# Patient Record
Sex: Female | Born: 2000 | Race: White | Hispanic: No | Marital: Single
Health system: Southern US, Community
[De-identification: ages and names within clinical notes are randomized; demographics above are authoritative.]

## PROBLEM LIST (undated history)

## (undated) DIAGNOSIS — Z803 Family history of malignant neoplasm of breast: Secondary | ICD-10-CM

## (undated) DIAGNOSIS — F419 Anxiety disorder, unspecified: Secondary | ICD-10-CM

## (undated) DIAGNOSIS — Z800 Family history of malignant neoplasm of digestive organs: Secondary | ICD-10-CM

## (undated) DIAGNOSIS — Z8042 Family history of malignant neoplasm of prostate: Secondary | ICD-10-CM

## (undated) DIAGNOSIS — Z8041 Family history of malignant neoplasm of ovary: Secondary | ICD-10-CM

## (undated) DIAGNOSIS — K5900 Constipation, unspecified: Secondary | ICD-10-CM

## (undated) DIAGNOSIS — F840 Autistic disorder: Secondary | ICD-10-CM

## (undated) DIAGNOSIS — F845 Asperger's syndrome: Secondary | ICD-10-CM

## (undated) HISTORY — PX: MYRINGOPLASTY: SUR873

## (undated) HISTORY — DX: Asperger's syndrome: F84.5

## (undated) HISTORY — DX: Constipation, unspecified: K59.00

## (undated) HISTORY — DX: Family history of malignant neoplasm of prostate: Z80.42

## (undated) HISTORY — DX: Family history of malignant neoplasm of breast: Z80.3

## (undated) HISTORY — PX: MOLE REMOVAL: SHX2046

## (undated) HISTORY — DX: Anxiety disorder, unspecified: F41.9

## (undated) HISTORY — DX: Family history of malignant neoplasm of digestive organs: Z80.0

## (undated) HISTORY — PX: ADENOIDECTOMY: SUR15

## (undated) HISTORY — DX: Family history of malignant neoplasm of ovary: Z80.41

---
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
## 2022-06-01 ENCOUNTER — Telehealth: Payer: Self-pay | Admitting: Genetic Counselor

## 2022-06-01 NOTE — Telephone Encounter (Signed)
Scheduled appt per 10/24 referral. Spoke to pt's mother, Lamar Naef, who requested her daughter be scheduled the same day/time as her. She will pass appt information along to pt.

## 2022-07-20 ENCOUNTER — Inpatient Hospital Stay: Payer: 59

## 2022-07-20 ENCOUNTER — Inpatient Hospital Stay: Payer: 59 | Attending: Genetic Counselor | Admitting: Genetic Counselor

## 2022-07-20 ENCOUNTER — Other Ambulatory Visit: Payer: Self-pay

## 2022-07-20 DIAGNOSIS — Z800 Family history of malignant neoplasm of digestive organs: Secondary | ICD-10-CM

## 2022-07-20 DIAGNOSIS — Z8041 Family history of malignant neoplasm of ovary: Secondary | ICD-10-CM

## 2022-07-20 DIAGNOSIS — Z8042 Family history of malignant neoplasm of prostate: Secondary | ICD-10-CM

## 2022-07-20 DIAGNOSIS — Z803 Family history of malignant neoplasm of breast: Secondary | ICD-10-CM | POA: Diagnosis not present

## 2022-07-21 ENCOUNTER — Encounter: Payer: Self-pay | Admitting: Genetic Counselor

## 2022-07-21 DIAGNOSIS — Z8041 Family history of malignant neoplasm of ovary: Secondary | ICD-10-CM | POA: Insufficient documentation

## 2022-07-21 DIAGNOSIS — Z8042 Family history of malignant neoplasm of prostate: Secondary | ICD-10-CM | POA: Insufficient documentation

## 2022-07-21 DIAGNOSIS — Z800 Family history of malignant neoplasm of digestive organs: Secondary | ICD-10-CM | POA: Insufficient documentation

## 2022-07-21 DIAGNOSIS — Z803 Family history of malignant neoplasm of breast: Secondary | ICD-10-CM | POA: Insufficient documentation

## 2022-07-21 NOTE — Progress Notes (Signed)
REFERRING PROVIDER: Marda Stalker, PA-C [place],  [place] [address]  PRIMARY PROVIDER:  Kathyrn Lass, MD  PRIMARY REASON FOR VISIT:  1. Family history of ovarian cancer   2. Family history of breast cancer   3. Family history of colon cancer   4. Family history of prostate cancer      HISTORY OF PRESENT ILLNESS:   Kathy Davis, a 21 y.o. female, was seen for a [facility] cancer genetics consultation at the request of Kathy Davis due to a family history of cancer and a known BRCA2 mutation in her maternal great aunt.  Kathy Davis presents to clinic today to discuss the possibility of a hereditary predisposition to cancer, genetic testing, and to further clarify her future cancer risks, as well as potential cancer risks for family members.   Kathy Davis is a 21 y.o. female with no personal history of cancer.    CANCER HISTORY:  Oncology History   No history exists.     RISK FACTORS:  Menarche was at age 14.  First live birth at age N/A.  OCP use for approximately 1 years.  Ovaries intact: yes.  Hysterectomy: no.  Menopausal status: premenopausal.  HRT use: 0 years. Colonoscopy: no; not examined. Mammogram within the last year: no. Number of breast biopsies: 0. Up to date with pelvic exams: no. Any excessive radiation exposure in the past: no  Past Medical History:  Diagnosis Date   Anxiety    Asperger's syndrome    Autism spectrum    Constipation    Family history of breast cancer    Family history of colon cancer    Family history of ovarian cancer    Family history of prostate cancer     Past Surgical History:  Procedure Laterality Date   ADENOIDECTOMY     MOLE REMOVAL     MYRINGOPLASTY      Social History   Socioeconomic History   Marital status: Single    Spouse name: Not on file   Number of children: Not on file   Years of education: Not on file   Highest education level: Not on file  Occupational History   Not on file   Tobacco Use   Smoking status: Passive Smoke Exposure - Never Smoker   Smokeless tobacco: Never  Vaping Use   Vaping Use: Never used  Substance and Sexual Activity   Alcohol use: No   Drug use: No   Sexual activity: Not on file  Other Topics Concern   Not on file  Social History Narrative   Work or School: going to [employer] classical school this fall - 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  Significant diagnoses are listed below: Family History  Problem Relation Age of Onset   Depression Mother    Diabetes Maternal Grandfather    Dementia Maternal Grandfather    Ovarian cancer Other  PGF's sister   Colon cancer Other        PGF's sister   Pancreatic cancer Other        PGF's father   Prostate cancer Maternal Great-grandfather        MGM's father   Breast cancer Maternal Great-grandmother        MGMs mother     The patient does not have children and does not have maternal siblings.  Both parents are living.  The patient does not keep in touch with her father and no information is known on his side.  The patient's mother is living and cancer free. She has a brother who is cancer free.  The maternal grandparents are living.  The grandfather's sister was diagnosed with ovarian, colon and ureter cancer.  She had a BRCA2 mutation on her tumor which the patient thinks was confirmed on the germline.  The other sister has a daughter who had breast cancer at 65 and reportedly has a BRCA2  mutation. The maternal grandfather's father died of pancreatic cancer.  The grandmothers mother had breast cancer and her father had prostate cancer.  Kathy Davis is aware of previous family history of genetic testing for hereditary cancer risks. Patient's maternal ancestors are of [place], [place], [place], [place] and [place] descent, and paternal ancestors are of Caucasian descent. There is no reported Ashkenazi Jewish ancestry. There is no known consanguinity.  GENETIC COUNSELING ASSESSMENT: Kathy Davis is a 21 y.o. female with a family history of cancer which is somewhat suggestive of a hereditary breast and ovarian cancer syndrome and predisposition to cancer given the combination of cancers and known BRCA2 mutation. We, therefore, discussed and recommended the following at today's visit.   DISCUSSION: We discussed that, in general, most cancer is not inherited in families, but instead is sporadic or familial. Sporadic cancers occur by chance and typically happen at older ages (>50 years) as this type of cancer is caused by genetic changes acquired during an individual's lifetime. Some families have more cancers than would be expected by chance; however, the ages or types of cancer are not consistent with a known genetic mutation or known genetic mutations have been ruled out. This type of familial cancer is thought to be due to a combination of multiple genetic, environmental, hormonal, and lifestyle factors. While this combination of factors likely increases the risk of cancer, the exact source of this risk is not currently identifiable or testable.  We discussed that 5 - 10% of breast cancer and up to 20% of ovarian cancer is hereditary, with most cases associated with BRCA mutations.  There are other genes that can be associated with hereditary breast and ovarian cancer syndromes, however the known BRCA2 makes other gene mutations less likely.  Kathy Davis has an approximate 12.5% risk for having a BRCA2  mutation based on her maternal great aunt testing positive.  Her mother is with her at today's visit and is considering undergoing testing.  We recommend that her mother undergo testing first, and depending on the mother's results will determine if we need to test Kathy Davis.    We discussed that some people do not want to undergo genetic testing due to fear of genetic discrimination.  A federal law called the Genetic Information Non-Discrimination Act (GINA) of 2008 helps protect individuals against genetic discrimination based on their genetic test results.  It impacts both health insurance and employment.  With health insurance, it protects against increased premiums, being kicked off insurance or  being forced to take a test in order to be insured.  For employment it protects against hiring, firing and promoting decisions based on genetic test results.  GINA does not apply to those in the [employer], those who work for companies with less than 15 employees, and new life insurance or long-term disability insurance policies.  Health status due to a cancer diagnosis is not protected under GINA.   We reviewed the characteristics, features and inheritance patterns of hereditary cancer syndromes. We also discussed genetic testing, including the appropriate family members to test, the process of testing, insurance coverage and turn-around-time for results. We discussed the implications of a negative, positive, carrier and/or variant of uncertain significant result. Kathy Davis  was offered a common hereditary cancer panel (47 genes) and an expanded pan-cancer panel (77 genes). Kathy Davis was informed of the benefits and limitations of each panel, including that expanded pan-cancer panels contain genes that do not have clear management guidelines at this point in time.  We also discussed that as the number of genes included on a panel increases, the chances of variants of uncertain significance increases.Based on the  lack of information of her paternal side of the family, we would recommend for Kathy Davis to pursue genetic testing for the CancerNext-Expanded+RNAinsight gene panel.   The CancerNext-Expanded gene panel offered by [facility] and includes sequencing and rearrangement analysis for the following 77 genes: AIP, ALK, APC*, ATM*, AXIN2, BAP1, BARD1, BLM, BMPR1A, BRCA1*, BRCA2*, BRIP1*, CDC73, CDH1*, CDK4, CDKN1B, CDKN2A, CHEK2*, CTNNA1, DICER1, FANCC, FH, FLCN, GALNT12, KIF1B, LZTR1, MAX, MEN1, MET, MLH1*, MSH2*, MSH3, MSH6*, MUTYH*, NBN, NF1*, NF2, NTHL1, PALB2*, PHOX2B, PMS2*, POT1, PRKAR1A, PTCH1, PTEN*, RAD51C*, RAD51D*, RB1, RECQL, RET, SDHA, SDHAF2, SDHB, SDHC, SDHD, SMAD4, SMARCA4, SMARCB1, SMARCE1, STK11, SUFU, TMEM127, TP53*, TSC1, TSC2, VHL and XRCC2 (sequencing and deletion/duplication); EGFR, EGLN1, HOXB13, KIT, MITF, PDGFRA, POLD1, and POLE (sequencing only); EPCAM and GREM1 (deletion/duplication only). DNA and RNA analyses performed for * genes.   Based on Kathy Davis's family history of cancer, she meets medical criteria for genetic testing. Despite that she meets criteria, she may still have an out of pocket cost. We discussed that if her out of pocket cost for testing is over $100, the laboratory will call and confirm whether she wants to proceed with testing.  If the out of pocket cost of testing is less than $100 she will be billed by the genetic testing laboratory.   PLAN: We will wait on testing Kathy Davis until her mother's test result comes back.  Depending on that result will determine whether Kathy Davis will need to be tested.  Based on Kathy Davis's family history, we recommended her mother have genetic counseling and testing. Kathy Davis will let us know if we can be of any assistance in coordinating genetic counseling and/or testing for this family member.   Lastly, we encouraged Kathy Davis to remain in contact with cancer genetics annually so that we can continuously update the  family history and inform her of any changes in cancer genetics and testing that may be of benefit for this family.   Kathy Davis questions were answered to her satisfaction today. Our contact information was provided should additional questions or concerns arise. Thank you for the referral and allowing [place] to share in the care of your patient.   Mansur Patti P. Florene Glen, [place], [facility] Licensed, Insurance risk surveyor Santiago [email or web address] phone: [phone]  The patient was seen for a total of 45 minutes in face-to-face genetic counseling.  The patient brought her mother. Drs. Michell Heinrich, and/or [place] were available for questions, if needed..    _______________________________________________________________________ For Office Staff:  Number of people involved in session: 2 Was an Intern/ student involved with case: no

## 2022-08-04 ENCOUNTER — Other Ambulatory Visit: Payer: Self-pay

## 2022-08-04 ENCOUNTER — Encounter: Payer: Self-pay | Admitting: Genetic Counselor

## 2022-08-11 DIAGNOSIS — F840 Autistic disorder: Secondary | ICD-10-CM | POA: Diagnosis not present

## 2022-08-25 DIAGNOSIS — F4325 Adjustment disorder with mixed disturbance of emotions and conduct: Secondary | ICD-10-CM | POA: Diagnosis not present

## 2022-09-02 DIAGNOSIS — F649 Gender identity disorder, unspecified: Secondary | ICD-10-CM | POA: Diagnosis not present

## 2022-09-08 DIAGNOSIS — F840 Autistic disorder: Secondary | ICD-10-CM | POA: Diagnosis not present

## 2022-09-22 DIAGNOSIS — F840 Autistic disorder: Secondary | ICD-10-CM | POA: Diagnosis not present

## 2022-10-06 DIAGNOSIS — F840 Autistic disorder: Secondary | ICD-10-CM | POA: Diagnosis not present

## 2022-10-20 DIAGNOSIS — F840 Autistic disorder: Secondary | ICD-10-CM | POA: Diagnosis not present

## 2022-12-01 DIAGNOSIS — F840 Autistic disorder: Secondary | ICD-10-CM | POA: Diagnosis not present

## 2022-12-15 DIAGNOSIS — F840 Autistic disorder: Secondary | ICD-10-CM | POA: Diagnosis not present

## 2023-01-26 DIAGNOSIS — F840 Autistic disorder: Secondary | ICD-10-CM | POA: Diagnosis not present

## 2023-01-26 DIAGNOSIS — F649 Gender identity disorder, unspecified: Secondary | ICD-10-CM | POA: Diagnosis not present

## 2023-02-09 DIAGNOSIS — F840 Autistic disorder: Secondary | ICD-10-CM | POA: Diagnosis not present

## 2023-02-23 DIAGNOSIS — F840 Autistic disorder: Secondary | ICD-10-CM | POA: Diagnosis not present

## 2023-03-09 DIAGNOSIS — F840 Autistic disorder: Secondary | ICD-10-CM | POA: Diagnosis not present

## 2023-03-23 DIAGNOSIS — F840 Autistic disorder: Secondary | ICD-10-CM | POA: Diagnosis not present

## 2023-04-06 DIAGNOSIS — F640 Transsexualism: Secondary | ICD-10-CM | POA: Diagnosis not present

## 2023-04-20 DIAGNOSIS — F840 Autistic disorder: Secondary | ICD-10-CM | POA: Diagnosis not present

## 2023-05-04 DIAGNOSIS — F840 Autistic disorder: Secondary | ICD-10-CM | POA: Diagnosis not present

## 2023-05-18 DIAGNOSIS — F840 Autistic disorder: Secondary | ICD-10-CM | POA: Diagnosis not present

## 2023-06-01 DIAGNOSIS — F840 Autistic disorder: Secondary | ICD-10-CM | POA: Diagnosis not present

## 2023-06-09 DIAGNOSIS — M2550 Pain in unspecified joint: Secondary | ICD-10-CM | POA: Diagnosis not present

## 2023-06-09 DIAGNOSIS — F411 Generalized anxiety disorder: Secondary | ICD-10-CM | POA: Diagnosis not present

## 2023-06-09 DIAGNOSIS — F988 Other specified behavioral and emotional disorders with onset usually occurring in childhood and adolescence: Secondary | ICD-10-CM | POA: Diagnosis not present

## 2023-07-18 DIAGNOSIS — F419 Anxiety disorder, unspecified: Secondary | ICD-10-CM | POA: Diagnosis not present

## 2023-07-18 DIAGNOSIS — F649 Gender identity disorder, unspecified: Secondary | ICD-10-CM | POA: Diagnosis not present

## 2023-07-18 DIAGNOSIS — Z79899 Other long term (current) drug therapy: Secondary | ICD-10-CM | POA: Diagnosis not present

## 2023-07-18 DIAGNOSIS — F640 Transsexualism: Secondary | ICD-10-CM | POA: Diagnosis not present

## 2023-07-18 DIAGNOSIS — K581 Irritable bowel syndrome with constipation: Secondary | ICD-10-CM | POA: Diagnosis not present

## 2023-07-18 DIAGNOSIS — F32A Depression, unspecified: Secondary | ICD-10-CM | POA: Diagnosis not present

## 2023-07-18 DIAGNOSIS — Z881 Allergy status to other antibiotic agents status: Secondary | ICD-10-CM | POA: Diagnosis not present

## 2023-10-24 DIAGNOSIS — F649 Gender identity disorder, unspecified: Secondary | ICD-10-CM | POA: Diagnosis not present

## 2023-11-03 ENCOUNTER — Other Ambulatory Visit (HOSPITAL_COMMUNITY)
Admission: RE | Admit: 2023-11-03 | Discharge: 2023-11-03 | Disposition: A | Discharge status: Home / Self Care | Payer: Self-pay | Source: Ambulatory Visit | Attending: Nurse Practitioner | Admitting: Nurse Practitioner

## 2023-11-03 ENCOUNTER — Other Ambulatory Visit: Payer: Self-pay | Admitting: Nurse Practitioner

## 2023-11-03 DIAGNOSIS — N939 Abnormal uterine and vaginal bleeding, unspecified: Secondary | ICD-10-CM | POA: Diagnosis not present

## 2023-11-03 DIAGNOSIS — Z113 Encounter for screening for infections with a predominantly sexual mode of transmission: Secondary | ICD-10-CM | POA: Diagnosis not present

## 2023-11-03 DIAGNOSIS — R300 Dysuria: Secondary | ICD-10-CM | POA: Diagnosis not present

## 2023-11-03 DIAGNOSIS — Z124 Encounter for screening for malignant neoplasm of cervix: Secondary | ICD-10-CM | POA: Insufficient documentation

## 2023-11-03 DIAGNOSIS — Z975 Presence of (intrauterine) contraceptive device: Secondary | ICD-10-CM | POA: Diagnosis not present

## 2023-11-03 DIAGNOSIS — R102 Pelvic and perineal pain: Secondary | ICD-10-CM | POA: Diagnosis not present

## 2023-11-08 LAB — CYTOLOGY - PAP: Diagnosis: NEGATIVE

## 2023-11-15 ENCOUNTER — Encounter: Payer: Self-pay | Admitting: Internal Medicine

## 2024-06-11 DIAGNOSIS — F411 Generalized anxiety disorder: Secondary | ICD-10-CM | POA: Diagnosis not present

## 2024-06-11 DIAGNOSIS — F988 Other specified behavioral and emotional disorders with onset usually occurring in childhood and adolescence: Secondary | ICD-10-CM | POA: Diagnosis not present

## 2024-06-11 DIAGNOSIS — D229 Melanocytic nevi, unspecified: Secondary | ICD-10-CM | POA: Diagnosis not present

## 2024-06-20 DIAGNOSIS — D229 Melanocytic nevi, unspecified: Secondary | ICD-10-CM | POA: Diagnosis not present

## 2024-06-20 DIAGNOSIS — D2372 Other benign neoplasm of skin of left lower limb, including hip: Secondary | ICD-10-CM | POA: Diagnosis not present

## 2024-06-20 DIAGNOSIS — D2272 Melanocytic nevi of left lower limb, including hip: Secondary | ICD-10-CM | POA: Diagnosis not present

## 2024-06-20 DIAGNOSIS — D485 Neoplasm of uncertain behavior of skin: Secondary | ICD-10-CM | POA: Diagnosis not present

## 2024-06-26 NOTE — Progress Notes (Unsigned)
   LILLETTE Ileana Collet, PhD, LAT, ATC acting as a scribe for Artist Lloyd, MD.  Kathy Davis is a 23 y.o. female who presents to [facility] today for evaluation of hypermobility.  MS: {ZIDFDX:66204} Skin/Immune reactions: {EDSDERMIMM:33796} Neurological: {EDSNEURO:33797} ANS: Anxiety / Panic Respiratory: {EDSRESP:33799} CV: {EDSCARDIO:33816} GI:  {EDSGI:33817} Genitourinary: {EDSUROGEN:33818} Hands & Feet: {EDSHANDSFEET:33819}  Treatments tried: ***Have you done formal Physical Therapy? If so, when, where and for how long? Have you or do you participate in a home exercise program?   Pertinent review of systems: ***  Relevant historical information: ***   Exam:  There were no vitals taken for this visit. General: Well Developed, well nourished, and in no acute distress.   MSK: ***    Lab and Radiology Results No results found for this or any previous visit (from the past 72 hours). No results found.     Assessment and Plan: 23 y.o. female with ***   PDMP not reviewed this encounter. No orders of the defined types were placed in this encounter.  No orders of the defined types were placed in this encounter.    Discussed warning signs or symptoms. Please see discharge instructions. Patient expresses understanding.   ***

## 2024-06-27 ENCOUNTER — Ambulatory Visit: Payer: Self-pay | Admitting: Family Medicine

## 2024-06-27 ENCOUNTER — Encounter: Payer: Self-pay | Admitting: Family Medicine

## 2024-06-27 VITALS — BP 108/72 | HR 72 | Ht 65.0 in | Wt 130.0 lb

## 2024-06-27 DIAGNOSIS — Q7962 Hypermobile Ehlers-Danlos syndrome: Secondary | ICD-10-CM | POA: Diagnosis not present

## 2024-06-27 DIAGNOSIS — I951 Orthostatic hypotension: Secondary | ICD-10-CM | POA: Diagnosis not present

## 2024-06-27 NOTE — Patient Instructions (Signed)
 Thank you for coming in today.   Consider [facility] or the [facility] for Recumbent exercise.   A referral for physical therapy has been submitted. A representative from the physical therapy office will contact you to coordinate scheduling after confirming your benefits with your insurance provider. If you do not hear from the physical therapy office within the next 1-2 weeks, please let us  know.   Check out the book Disjointed Navigating the Diagnosis and Management of Hypermobile Ehlers-Danlos Syndrome and Hypermobility Spectrum Disorders.    For POTS symptoms: Consume 3 L water and 8-12 gram of salt per day   Check out these websites for exercises to try if you have POTS ([employer] Protocol, [place] Protocol, and [employer] Protocol) [email or web address]-[email or web address]/ https://dean.info/   Guide to Prof. Russek's Patient Self-Care Handouts [email or web address].Http://www.black-smith.[email or web address]/  Orthostatic Intolerance and Postural Orthostatic Tachycardia Self-Care Checklist [email or web address].Wvlyxc.com  Steps To Managing Your Mast Cell Activation Syndrome [email or web address].pdf  See you back in 1 month

## 2024-06-28 DIAGNOSIS — N939 Abnormal uterine and vaginal bleeding, unspecified: Secondary | ICD-10-CM | POA: Insufficient documentation

## 2024-06-28 DIAGNOSIS — F411 Generalized anxiety disorder: Secondary | ICD-10-CM | POA: Insufficient documentation

## 2024-06-28 DIAGNOSIS — I951 Orthostatic hypotension: Secondary | ICD-10-CM | POA: Insufficient documentation

## 2024-06-28 DIAGNOSIS — F4011 Social phobia, generalized: Secondary | ICD-10-CM | POA: Insufficient documentation

## 2024-06-28 DIAGNOSIS — Q7962 Hypermobile Ehlers-Danlos syndrome: Secondary | ICD-10-CM | POA: Insufficient documentation

## 2024-06-28 DIAGNOSIS — F909 Attention-deficit hyperactivity disorder, unspecified type: Secondary | ICD-10-CM | POA: Insufficient documentation

## 2024-06-28 DIAGNOSIS — F332 Major depressive disorder, recurrent severe without psychotic features: Secondary | ICD-10-CM | POA: Insufficient documentation

## 2024-07-19 NOTE — Therapy (Unsigned)
 OUTPATIENT PHYSICAL THERAPY HYPERMOBILITY EVAL  Patient Name: Kathy Davis MRN: [id number] DOB:20-Dec-2000, 23 y.o., adult Today's Date: 07/20/2024  END OF SESSION:  PT End of Session - 07/20/24 0938     Visit Number 1    Number of Visits 16    Date for Recertification  09/14/24    Authorization Type BCBS    PT Start Time 0935    PT Stop Time 1028    PT Time Calculation (min) 53 min    Activity Tolerance Patient tolerated treatment well    Behavior During Therapy [facility] for tasks assessed/performed;Flat affect          Past Medical History:  Diagnosis Date   Anxiety    Asperger's syndrome    Autism spectrum    Constipation    Family history of breast cancer    Family history of colon cancer    Family history of ovarian cancer    Family history of prostate cancer    Past Surgical History:  Procedure Laterality Date   ADENOIDECTOMY     MOLE REMOVAL     MYRINGOPLASTY     Patient Active Problem List   Diagnosis Date Noted   Abnormal uterine bleeding 06/28/2024   Attention deficit hyperactivity disorder 06/28/2024   Generalized anxiety disorder 06/28/2024   Generalized social phobia 06/28/2024   Severe recurrent major depression without psychotic features (HCC) 06/28/2024   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   Dysautonomia orthostatic hypotension syndrome 06/28/2024   Gender dysphoria 07/18/2023   Family history of ovarian cancer 07/21/2022   Family history of breast cancer 07/21/2022   Family history of colon cancer 07/21/2022   Family history of prostate cancer 07/21/2022   Autistic disorder 05/04/2013    PCP: Katina Pfeiffer PA   REFERRING PROVIDER: Joane Birmingham MD   REFERRING DIAG: Hypermobile Ehlers Danlos Syndrome  Rationale for Evaluation and Treatment: Rehabilitation  THERAPY DIAG:  Hypermobility syndrome  Other symptoms and signs involving the musculoskeletal system  Muscle weakness (generalized)  Pain in both knees, unspecified  chronicity  ONSET DATE: chronic   SUBJECTIVE:                                                                                                                                                                                           SUBJECTIVE STATEMENT: Patient was referred to Dr. Joane, found him on the EDS website.   Patient has always been in pain.  Noticed after the breast surgery that the pain was much better (due to medications).   Patient's mother is present to assist with history and intake.  Patient endorses a history of leg and  foot pain when they were younger.  They can remember having difficulty breathing when they were more active (running).  They identify knee and wrist pain most problematic. Cannot draw as long as they want to.  Stopped wearing the compression glove but does have one. The pain in the knees is mostly after activity, is the back of the knees but some in the front too.  They cannot stand you longer than 10 min.  Walking is better than standing but not too long. Denies weakness, has numbness occ due to positioning.  Uses the cane leaving the house.   Was more deconditioned since getting COVID last yr.  Not diagnosed with POTS or MCAS at this time.    Patient reports symptoms and/or instability in the following areas:  - Cervical Spine: [x] Pain [] Instability [] Limited ROM [] Paresthesia  - Thoracic Spine: [] Pain [] Instability  - Lumbar Spine: [x] Pain [] Instability [] Frequent locking/giving out  - Shoulder: [] L [] R  [] Subluxation [] Dislocations [] Pain [] Fatigue  - Elbow: [] L [] R  [] Instability [] Hyperextension [] Pain - occ. L UE instability X  - Wrist/Hand: [x] L [x] R  [] Instability [x] Fatigue with use [x] Pain  - Hip: [] L [] R  [] Instability [] Pain [] Clicking [] Gait deviations  - Knee: [x] L [x] R  [x] Instability [x] Hyperextension [x] Pain  - Ankle/Foot: [x] L [x] R  [x] Instability [x] Frequent sprains Pain X  Does the patient have a diagnosis of any of the  following: Fatigue[x]  Brain fog[x]  Lightheadedness[x]  Allergies[]  Occ.  GI[x]  Neurodiverse[x]  ADHD, Autism   Additional Notes:  ___________ nonbinary ___________________________   Patient-reported Beighton Score (if known): _______/9  Clinician-assessed Beighton Score (today): _______/9     PERTINENT HISTORY:  Chronic joint pain, Chronic wide spread muscle pain, Joint Hypermobility, anxiety, ADHD, autistic, strong family history of various cancers.    Relevant historical information: Patient underwent mastectomy 2024    PAIN:  Are you having pain? Yes: NPRS scale: 3/10-4/10 Pain location: knees, post> ant.   Pain description: not sure  Aggravating factors: standing, walking  Relieving factors: rest, OTC Ibuprofen    PRECAUTIONS: None  RED FLAGS: None   WEIGHT BEARING RESTRICTIONS: No  FALLS:  Has patient fallen in last 6 months? No  LIVING ENVIRONMENT: Lives with: lives with their family Lives in: House/apartment Stairs: both in and out- 5 STE, small 3 steps inside  Has following equipment at home: Single point cane  Cannot do all the steps at once.   OCCUPATION: not currently, was in [employer] college   PLOF: Independent, Vocation/Vocational requirements: not working, Leisure: drawing, does get overwhelmed with pain/sensory , and TV/books   PATIENT GOALS: To have less pain.    NEXT MD VISIT: as needed   OBJECTIVE:  Note: Objective measures were completed at Evaluation unless otherwise noted.  DIAGNOSTIC FINDINGS:  None   CARDIO/ORTHOSTATICS: Baseline RHR 68 Standing HR 74 Baseline BP 102/68 Standing BP 106/75   PATIENT SURVEYS:  PSFS: Given to patient to fill out at home   COGNITION: Overall cognitive status: Within functional limits for tasks assessed     SENSATION: Mild occ in feet    POSTURE: rounded shoulders, forward head, weight shift left, and leans L in standing   PALPATION: Tender to palpation bilateral infrapatellar area some mild  tenderness posteriorly as well. Pain reported with palpation to the thoracic and lumbar spine.  Thoracic spinal segments quite stiff, with mild hypermobility in the lumbar spine  LUMBAR ROM: Not tested  AROM eval  Flexion   Extension   Right lateral flexion   Left lateral flexion   Right rotation  Left rotation    (Blank rows = not tested)  LOWER EXTREMITY ROM:   WNL  Bilateral hamstring tightness Passive  Right eval Left eval  Hip flexion    Hip extension    Hip abduction    Hip adduction    Hip internal rotation 25-30 25-30  Hip external rotation 80-90 80-90  Knee flexion    Knee extension    Ankle dorsiflexion    Ankle plantarflexion    Ankle inversion    Ankle eversion     (Blank rows = not tested)  LOWER EXTREMITY MMT:    MMT Right eval Left eval  Hip flexion 4 4  Hip extension    Hip abduction    Hip adduction    Hip internal rotation    Hip external rotation    Knee flexion 4- 4-  Knee extension 4 4  Ankle dorsiflexion    Ankle plantarflexion    Ankle inversion    Ankle eversion     (Blank rows = not tested)  LUMBAR SPECIAL TESTS:  NT    CERVICAL ROM: NT    UE ROM: NT    UE MMT: NT   MMT Right 07/20/2024 Left 07/20/2024  Shoulder flexion    Shoulder extension    Shoulder abduction    Shoulder adduction    Shoulder extension    Shoulder internal rotation    Shoulder external rotation    Middle trapezius    Lower trapezius    Elbow flexion    Elbow extension    Wrist flexion    Wrist extension    Wrist ulnar deviation    Wrist radial deviation    Wrist pronation    Wrist supination    Grip strength     (Blank rows = not tested)  CERVICAL SPECIAL TESTS:  NT   FUNCTIONAL TESTS:  5 times sit to stand: 18.6 sec, HR up to 88   2 min walk test, 210 feet, min to mod dyspnea no increased pain      Beighton Scale Lumbar (_/1) Knees (_/2) Elbows (_/2)  5th digit (_2) Thumb (_/2) Comment on hips, shoulders     GAIT: Distance walked: 210 feet Assistive device utilized: Single point cane Level of assistance: Modified independence Comments: Slow deliberate gait reliance on the cane using in the right hand  TREATMENT DATE:   07/20/24:  PT evaluation completed 2-minute walk test, STS  Patient performed 5-10 repetitions of home exercise program Patient was instructed on general hypermobility principles of exercise Patient will be getting a reformer Christmas as a gift and so we discussed Pilates a bit as a pertains to hypermobility Advised to expect a bit of pain relief as strength improves                                                                                                                                    PATIENT EDUCATION:  Education details: see above Posture, alignment, neutral zone and joint protection PNE/Explain pain   Joint inflammation/collagen/ligament laxity, muscular support Stretching vs stabilizing  Person educated: Patient Education method: Explanation, Demonstration, Verbal cues, and Handouts Education comprehension: verbalized understanding and needs further education   HOME EXERCISE PROGRAM: Access Code: [email or web address] URL: [email or web address] Date: 07/20/2024 Prepared by: Delon Norma  Exercises - Active Straight Leg Raise with Quad Set  - 1 x daily - 7 x weekly - 2 sets - 5-10 reps - 5 hold - Sidelying Hip Abduction  - 1 x daily - 7 x weekly - 2 sets - 5-10 reps - 5 hold - Supine Hamstring Stretch  - 1 x daily - 7 x weekly - 2 sets - 5-10 reps - 5-10 hold - Pilates Bridge  - 1 x daily - 7 x weekly - 2 sets - 5-10 reps - 5 hold  ASSESSMENT:  CLINICAL IMPRESSION: Patient is a 23 y.o. adult who was seen today for physical therapy evaluation and treatment for H-EDS.  Patient will be having a precancerous area removed from their left leg soon which could preclude them from participating fully in rehab but we can definitely work around  it. Patient is most limited by cardiorespiratory endurance and lower body weakness.  OBJECTIVE IMPAIRMENTS: Abnormal gait, cardiopulmonary status limiting activity, decreased activity tolerance, decreased balance, decreased coordination, decreased endurance, decreased knowledge of condition, decreased mobility, difficulty walking, decreased ROM, decreased strength, dizziness, increased fascial restrictions, impaired flexibility, improper body mechanics, postural dysfunction, pain, and joint hypermobility/instability .   ACTIVITY LIMITATIONS: carrying, lifting, bending, standing, squatting, stairs, transfers, hygiene/grooming, locomotion level, and showering -has chair   PARTICIPATION LIMITATIONS: cleaning, laundry, interpersonal relationship, driving, shopping, community activity, occupation, and school  PERSONAL FACTORS: Past/current experiences, Social background, Time since onset of injury/illness/exacerbation, and 3+ comorbidities: Neuro diversity, anxiety, chronic fatigue and joint pain are also affecting patient's functional outcome.   REHAB POTENTIAL: Excellent  CLINICAL DECISION MAKING: Evolving/moderate complexity  EVALUATION COMPLEXITY: Moderate   GOALS: Goals reviewed with patient? Yes  SHORT TERM GOALS: Target date: 08/17/2024    Patient will be able to show independence for initial HEP to include posture, core and hip strength and stability.   Baseline: Goal status: INITIAL  2.  Patient will be able to demonstrate proper posture and lifting techniques related to spine health and reduction of symptoms.   Baseline:  Goal status: INITIAL  3.  Patient will complete the patient-specific functional scale and set goal  Baseline:  Goal status: INITIAL   LONG TERM GOALS: Target date: 09/14/2024    Patient will be independent with final HEP upon discharge from PT and report consistent benefit following exercise completion.    Baseline:  Goal status: INITIAL  2.  Patient  will be able to show good core stability and motor control with mat level intermediate stabilization exercises Baseline:  Goal status: INITIAL  3.  Patient will be I with concepts of joint protection and stability as it pertains to joint hypermobility. Baseline:  Goal status: INITIAL  4.  Patient will improve 2-minute walk test with or without assistive device by 100 feet or more  Baseline: 210 with cane  Goal status: INITIAL  5.  Patient will be able to report the ability to complete physical therapy/daily home program as well as 1 other activity that same day (shopping, meal)  Baseline:  Goal status: INITIAL  6.  Patient will demonstrate 5/ 5 lower body strength to maximize gait and functional strength. Baseline:  Goal status: INITIAL  PLAN:  PT FREQUENCY: 1-2x/week  PT DURATION: 8 weeks  PLANNED INTERVENTIONS: 97164- PT Re-evaluation, 97750- Physical Performance Testing, 97110-Therapeutic exercises, 97530- Therapeutic activity, [id number]- Neuromuscular re-education, 97535- Self Care, [id number]- Manual therapy, 20560 (1-2 muscles), 20561 (3+ muscles)- Dry Needling, Patient/Family education, Balance training, Stair training, Cryotherapy, and Moist heat.  PLAN FOR NEXT SESSION: check HEP, add as tolerated. Core, try reformer, recumbent bike    Kiet Geer, PT 07/20/2024, 10:48 AM

## 2024-07-20 ENCOUNTER — Ambulatory Visit (INDEPENDENT_AMBULATORY_CARE_PROVIDER_SITE_OTHER): Admitting: Physical Therapy

## 2024-07-20 ENCOUNTER — Encounter: Payer: Self-pay | Admitting: Physical Therapy

## 2024-07-20 DIAGNOSIS — R29898 Other symptoms and signs involving the musculoskeletal system: Secondary | ICD-10-CM | POA: Diagnosis not present

## 2024-07-20 DIAGNOSIS — M357 Hypermobility syndrome: Secondary | ICD-10-CM | POA: Diagnosis not present

## 2024-07-20 DIAGNOSIS — M6281 Muscle weakness (generalized): Secondary | ICD-10-CM | POA: Diagnosis not present

## 2024-07-20 DIAGNOSIS — M25562 Pain in left knee: Secondary | ICD-10-CM | POA: Diagnosis not present

## 2024-07-20 DIAGNOSIS — M25561 Pain in right knee: Secondary | ICD-10-CM | POA: Diagnosis not present

## 2024-07-27 ENCOUNTER — Ambulatory Visit: Admitting: Family Medicine

## 2024-07-27 ENCOUNTER — Encounter: Payer: Self-pay | Admitting: Family Medicine

## 2024-07-27 ENCOUNTER — Ambulatory Visit

## 2024-07-27 VITALS — BP 122/78 | HR 77 | Ht 65.0 in | Wt 132.0 lb

## 2024-07-27 DIAGNOSIS — G8929 Other chronic pain: Secondary | ICD-10-CM | POA: Diagnosis not present

## 2024-07-27 DIAGNOSIS — M79642 Pain in left hand: Secondary | ICD-10-CM

## 2024-07-27 DIAGNOSIS — D2272 Melanocytic nevi of left lower limb, including hip: Secondary | ICD-10-CM | POA: Diagnosis not present

## 2024-07-27 DIAGNOSIS — F649 Gender identity disorder, unspecified: Secondary | ICD-10-CM | POA: Diagnosis not present

## 2024-07-27 DIAGNOSIS — M79641 Pain in right hand: Secondary | ICD-10-CM

## 2024-07-27 DIAGNOSIS — M25561 Pain in right knee: Secondary | ICD-10-CM

## 2024-07-27 DIAGNOSIS — M25562 Pain in left knee: Secondary | ICD-10-CM

## 2024-07-27 DIAGNOSIS — Q7962 Hypermobile Ehlers-Danlos syndrome: Secondary | ICD-10-CM

## 2024-07-27 DIAGNOSIS — L905 Scar conditions and fibrosis of skin: Secondary | ICD-10-CM | POA: Diagnosis not present

## 2024-07-27 DIAGNOSIS — I951 Orthostatic hypotension: Secondary | ICD-10-CM

## 2024-07-27 NOTE — Progress Notes (Signed)
 "        I, Leotis Batter, CMA acting as a scribe for Artist Lloyd, MD.  Kathy Davis is a 23 y.o. adult who presents to [facility] today for 90-month f/u hEDS and dysautonomia. Pt was last seen by Dr. Lloyd on 06/27/24 and was advised on fluid/salt intake and to try Zyrtec and Pepcid.   Today, pt reports that MCAS and POTS sx have been well managed with increased salt and fluid intake, Pepcid and Zyrtec. Notes slight worsening hand/wrist and knee pain bilaterally. Has questions about Testosterone. Looking for a new PCP.   Patient does have gender dysphoria and is currently using they them pronouns.  Patient was assigned female at birth. Kathy Davis has never been on testosterone but is interested from a gender dysphoria perspective but also from a possibly improving Ehlers-Danlos perspective.  Dominant orthopedic problem today is bilateral hand pain.  Pain is located primarily at the base of the thumbs worse with activity.  No injury.  Pertinent review of systems: No fevers or chills  Relevant historical information: History of depression and gender dysphoria and Ehlers-Danlos syndrome. Autism spectrum disorder.  Exam:  BP 122/78   Pulse 77   Ht 5' 5 (1.651 m)   Wt 132 lb (59.9 kg)   SpO2 98%   BMI 21.97 kg/m  General: Well Developed, well nourished, and in no acute distress.   MSK: Hands bilaterally normal-appearing normal motion.  Mildly tender palpation first CMC both sides.    Lab and Radiology Results  X-ray images bilateral hands obtained today personally and independently interpreted.  Right hand: No acute fractures.  No significant degenerative changes.  Growth plates are closed.  Left hand: No acute fractures.  No significant degenerative changes.  Growth plates are closed.  Await formal radiology review     Assessment and Plan: 23 y.o. 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  Still having some  pain.  Hand pain focused around [facility] complicated by their hypermobility.  Plan for PT will switch locations consider OT in the future.  Recommend CMC brace and heat as well.  We talked about testosterone as it pertains to EDS.  I am not familiar enough with the risks or benefits of gender affirming testosterone care for folks with EDS.  I think if Kathy Davis has a reason to be on testosterone from a gender dysmorphia perspective it certainly reasonable and could likely help from an EDS perspective.  [facility] for attaining [facility] affirming care in this community with private health insurance.  Recheck in 3 months. PDMP not reviewed this encounter. Orders Placed This Encounter  Procedures   DG Hand Complete Left    Standing Status:   Future    Number of Occurrences:   1    Expiration Date:   08/27/2024    Reason for Exam (SYMPTOM  OR DIAGNOSIS REQUIRED):   bilat hand pain    Preferred imaging location?:   [facility] [place]    Is patient pregnant?:   No   DG Hand Complete Right    Standing Status:   Future    Number of Occurrences:   1    Expiration Date:   08/27/2024    Reason for Exam (SYMPTOM  OR DIAGNOSIS REQUIRED):   bilat hand pain    Preferred imaging location?:   [facility] [place]    Is patient pregnant?:   No   Ambulatory  referral to Physical Therapy    Referral Priority:   Routine    Referral Type:   Physical Medicine    Referral Reason:   Specialty Services Required    Requested Specialty:   Physical Therapy    Number of Visits Requested:   1   No orders of the defined types were placed in this encounter.    Discussed warning signs or symptoms. Please see discharge instructions. Patient expresses understanding.   The above documentation has been reviewed and is accurate and complete Artist Lloyd, M.D.   "

## 2024-07-27 NOTE — Patient Instructions (Addendum)
 Thank you for coming in today.   Kathy Davis does primary care at [facility]. List provided of PCP that are knowledgeable about EDS.   Referral placed for PT with Hallie at [facility].   CMC joint brace.   Heat to the hands. Consider parafin wax.  [facility] with [facility] about Testosterone treatment.   See you back in 3 months.

## 2024-08-15 ENCOUNTER — Ambulatory Visit: Payer: Self-pay | Admitting: Family Medicine

## 2024-08-15 NOTE — Progress Notes (Signed)
Right hand x-ray looks okay to radiology.

## 2024-08-15 NOTE — Progress Notes (Signed)
Left hand x-ray looks okay to radiology

## 2024-08-16 ENCOUNTER — Encounter: Admitting: Physical Therapy

## 2024-08-28 ENCOUNTER — Encounter: Admitting: Physical Therapy

## 2024-09-06 ENCOUNTER — Encounter: Admitting: Physical Therapy

## 2024-09-13 ENCOUNTER — Encounter: Admitting: Physical Therapy

## 2024-10-25 ENCOUNTER — Ambulatory Visit: Admitting: Family Medicine
